# Patient Record
Sex: Female | Born: 1975 | Race: White | Hispanic: No | Marital: Married | Smoking: Current every day smoker
Health system: Southern US, Community
[De-identification: ages and names within clinical notes are randomized; demographics above are authoritative.]

## PROBLEM LIST (undated history)

## (undated) DIAGNOSIS — G43909 Migraine, unspecified, not intractable, without status migrainosus: Secondary | ICD-10-CM

## (undated) DIAGNOSIS — F329 Major depressive disorder, single episode, unspecified: Secondary | ICD-10-CM

## (undated) DIAGNOSIS — Z90710 Acquired absence of both cervix and uterus: Secondary | ICD-10-CM

## (undated) DIAGNOSIS — I10 Essential (primary) hypertension: Secondary | ICD-10-CM

## (undated) DIAGNOSIS — K219 Gastro-esophageal reflux disease without esophagitis: Secondary | ICD-10-CM

## (undated) DIAGNOSIS — F32A Depression, unspecified: Secondary | ICD-10-CM

## (undated) DIAGNOSIS — E785 Hyperlipidemia, unspecified: Secondary | ICD-10-CM

## (undated) DIAGNOSIS — Z9889 Other specified postprocedural states: Secondary | ICD-10-CM

## (undated) DIAGNOSIS — K2270 Barrett's esophagus without dysplasia: Secondary | ICD-10-CM

---

## 2009-03-05 ENCOUNTER — Ambulatory Visit: Payer: Self-pay | Admitting: Unknown Physician Specialty

## 2009-07-01 ENCOUNTER — Ambulatory Visit: Payer: Self-pay | Admitting: Gynecologic Oncology

## 2009-07-31 ENCOUNTER — Ambulatory Visit: Payer: Self-pay | Admitting: Gynecologic Oncology

## 2009-08-11 ENCOUNTER — Ambulatory Visit: Payer: Self-pay | Admitting: Gynecologic Oncology

## 2009-08-31 ENCOUNTER — Ambulatory Visit: Payer: Self-pay | Admitting: Gynecologic Oncology

## 2009-10-01 ENCOUNTER — Ambulatory Visit: Payer: Self-pay | Admitting: Gynecologic Oncology

## 2009-10-27 ENCOUNTER — Ambulatory Visit: Payer: Self-pay | Admitting: Gynecologic Oncology

## 2009-10-31 ENCOUNTER — Ambulatory Visit: Payer: Self-pay | Admitting: Gynecologic Oncology

## 2009-11-03 ENCOUNTER — Ambulatory Visit: Payer: Self-pay | Admitting: Gynecologic Oncology

## 2009-11-10 ENCOUNTER — Ambulatory Visit: Payer: Self-pay | Admitting: Gynecologic Oncology

## 2009-11-17 ENCOUNTER — Ambulatory Visit: Payer: Self-pay | Admitting: Gynecologic Oncology

## 2009-12-01 ENCOUNTER — Ambulatory Visit: Payer: Self-pay | Admitting: Gynecologic Oncology

## 2010-01-12 ENCOUNTER — Ambulatory Visit: Payer: Self-pay | Admitting: Gynecologic Oncology

## 2010-01-17 ENCOUNTER — Emergency Department: Payer: Self-pay | Admitting: Unknown Physician Specialty

## 2010-01-31 ENCOUNTER — Ambulatory Visit: Payer: Self-pay | Admitting: Gynecologic Oncology

## 2010-02-23 ENCOUNTER — Ambulatory Visit: Payer: Self-pay

## 2010-03-02 ENCOUNTER — Inpatient Hospital Stay: Payer: Self-pay

## 2011-10-27 ENCOUNTER — Ambulatory Visit: Payer: Self-pay | Admitting: Internal Medicine

## 2012-01-10 ENCOUNTER — Ambulatory Visit: Payer: Self-pay | Admitting: Unknown Physician Specialty

## 2012-11-26 ENCOUNTER — Ambulatory Visit: Payer: Self-pay | Admitting: Internal Medicine

## 2012-11-29 ENCOUNTER — Ambulatory Visit: Payer: Self-pay | Admitting: Internal Medicine

## 2014-02-12 ENCOUNTER — Ambulatory Visit: Payer: Self-pay | Admitting: Internal Medicine

## 2015-04-29 ENCOUNTER — Other Ambulatory Visit: Payer: Self-pay | Admitting: Internal Medicine

## 2015-04-29 DIAGNOSIS — Z1231 Encounter for screening mammogram for malignant neoplasm of breast: Secondary | ICD-10-CM

## 2015-05-12 ENCOUNTER — Emergency Department: Payer: BLUE CROSS/BLUE SHIELD

## 2015-05-12 ENCOUNTER — Encounter: Payer: Self-pay | Admitting: Emergency Medicine

## 2015-05-12 ENCOUNTER — Inpatient Hospital Stay
Admission: EM | Admit: 2015-05-12 | Discharge: 2015-05-13 | DRG: 872 | Disposition: A | Discharge status: Home / Self Care | Payer: BLUE CROSS/BLUE SHIELD | Attending: Internal Medicine | Admitting: Internal Medicine

## 2015-05-12 DIAGNOSIS — K219 Gastro-esophageal reflux disease without esophagitis: Secondary | ICD-10-CM | POA: Diagnosis present

## 2015-05-12 DIAGNOSIS — Z8249 Family history of ischemic heart disease and other diseases of the circulatory system: Secondary | ICD-10-CM | POA: Diagnosis not present

## 2015-05-12 DIAGNOSIS — N3000 Acute cystitis without hematuria: Secondary | ICD-10-CM | POA: Diagnosis present

## 2015-05-12 DIAGNOSIS — Z90710 Acquired absence of both cervix and uterus: Secondary | ICD-10-CM

## 2015-05-12 DIAGNOSIS — N1330 Unspecified hydronephrosis: Secondary | ICD-10-CM | POA: Diagnosis present

## 2015-05-12 DIAGNOSIS — R109 Unspecified abdominal pain: Secondary | ICD-10-CM

## 2015-05-12 DIAGNOSIS — Z823 Family history of stroke: Secondary | ICD-10-CM

## 2015-05-12 DIAGNOSIS — I10 Essential (primary) hypertension: Secondary | ICD-10-CM | POA: Diagnosis present

## 2015-05-12 DIAGNOSIS — N10 Acute tubulo-interstitial nephritis: Secondary | ICD-10-CM | POA: Diagnosis present

## 2015-05-12 DIAGNOSIS — B9620 Unspecified Escherichia coli [E. coli] as the cause of diseases classified elsewhere: Secondary | ICD-10-CM | POA: Diagnosis present

## 2015-05-12 DIAGNOSIS — N12 Tubulo-interstitial nephritis, not specified as acute or chronic: Secondary | ICD-10-CM | POA: Diagnosis not present

## 2015-05-12 DIAGNOSIS — A419 Sepsis, unspecified organism: Secondary | ICD-10-CM | POA: Diagnosis not present

## 2015-05-12 DIAGNOSIS — K2270 Barrett's esophagus without dysplasia: Secondary | ICD-10-CM | POA: Diagnosis present

## 2015-05-12 HISTORY — DX: Essential (primary) hypertension: I10

## 2015-05-12 HISTORY — DX: Barrett's esophagus without dysplasia: K22.70

## 2015-05-12 HISTORY — DX: Gastro-esophageal reflux disease without esophagitis: K21.9

## 2015-05-12 HISTORY — DX: Hyperlipidemia, unspecified: E78.5

## 2015-05-12 HISTORY — DX: Migraine, unspecified, not intractable, without status migrainosus: G43.909

## 2015-05-12 HISTORY — DX: Other specified postprocedural states: Z98.890

## 2015-05-12 HISTORY — DX: Major depressive disorder, single episode, unspecified: F32.9

## 2015-05-12 HISTORY — DX: Acquired absence of both cervix and uterus: Z90.710

## 2015-05-12 HISTORY — DX: Depression, unspecified: F32.A

## 2015-05-12 LAB — URINALYSIS COMPLETE WITH MICROSCOPIC (ARMC ONLY)
BILIRUBIN URINE: NEGATIVE
Glucose, UA: NEGATIVE mg/dL
KETONES UR: NEGATIVE mg/dL
NITRITE: NEGATIVE
PH: 9 — AB (ref 5.0–8.0)
Protein, ur: 100 mg/dL — AB
Specific Gravity, Urine: 1.015 (ref 1.005–1.030)
TRANS EPITHEL UA: 1

## 2015-05-12 LAB — COMPREHENSIVE METABOLIC PANEL
ALT: 13 U/L — AB (ref 14–54)
ANION GAP: 11 (ref 5–15)
AST: 37 U/L (ref 15–41)
Albumin: 4.3 g/dL (ref 3.5–5.0)
Alkaline Phosphatase: 112 U/L (ref 38–126)
BILIRUBIN TOTAL: 0.6 mg/dL (ref 0.3–1.2)
BUN: 11 mg/dL (ref 6–20)
CO2: 24 mmol/L (ref 22–32)
CREATININE: 0.69 mg/dL (ref 0.44–1.00)
Calcium: 9 mg/dL (ref 8.9–10.3)
Chloride: 102 mmol/L (ref 101–111)
GFR calc Af Amer: >60 mL/min (ref >60)
Glucose, Bld: 90 mg/dL (ref 65–99)
Potassium: 3.6 mmol/L (ref 3.5–5.1)
Sodium: 137 mmol/L (ref 135–145)
TOTAL PROTEIN: 7.6 g/dL (ref 6.5–8.1)

## 2015-05-12 LAB — CBC
HCT: 41.9 % (ref 35.0–47.0)
Hemoglobin: 14.6 g/dL (ref 12.0–16.0)
MCH: 36.4 pg — AB (ref 26.0–34.0)
MCHC: 34.8 g/dL (ref 32.0–36.0)
MCV: 104.6 fL — AB (ref 80.0–100.0)
PLATELETS: 214 10*3/uL (ref 150–440)
RBC: 4.01 MIL/uL (ref 3.80–5.20)
RDW: 12.9 % (ref 11.5–14.5)
WBC: 12.6 10*3/uL — AB (ref 3.6–11.0)

## 2015-05-12 LAB — LACTIC ACID, PLASMA: Lactic Acid, Venous: 1.7 mmol/L (ref 0.5–2.0)

## 2015-05-12 LAB — LIPASE, BLOOD: LIPASE: 47 U/L (ref 11–51)

## 2015-05-12 MED ORDER — HYDROMORPHONE HCL 1 MG/ML IJ SOLN
0.5000 mg | Freq: Once | INTRAMUSCULAR | Status: AC
  Administered 2015-05-12: 0.5 mg via INTRAVENOUS
  Filled 2015-05-12: qty 1

## 2015-05-12 MED ORDER — POLYETHYLENE GLYCOL 3350 17 G PO PACK: 17.0000 g | PACK | Freq: Every day | ORAL | Status: DC | PRN

## 2015-05-12 MED ORDER — MORPHINE SULFATE (PF) 2 MG/ML IV SOLN
2.0000 mg | INTRAVENOUS | Status: DC | PRN
  Administered 2015-05-12: 2 mg via INTRAVENOUS
  Filled 2015-05-12: qty 1

## 2015-05-12 MED ORDER — CLONAZEPAM 0.5 MG PO TABS: 0.2500 mg | ORAL_TABLET | Freq: Every evening | ORAL | Status: DC | PRN

## 2015-05-12 MED ORDER — KETOROLAC TROMETHAMINE 30 MG/ML IJ SOLN
30.0000 mg | Freq: Three times a day (TID) | INTRAMUSCULAR | Status: AC
  Administered 2015-05-12 – 2015-05-13 (×3): 30 mg via INTRAVENOUS
  Filled 2015-05-12 (×3): qty 1

## 2015-05-12 MED ORDER — ONDANSETRON HCL 4 MG/2ML IJ SOLN: 4.0000 mg | Freq: Four times a day (QID) | INTRAMUSCULAR | Status: DC | PRN

## 2015-05-12 MED ORDER — ENOXAPARIN SODIUM 40 MG/0.4ML ~~LOC~~ SOLN
40.0000 mg | SUBCUTANEOUS | Status: DC
  Filled 2015-05-12 (×2): qty 0.4

## 2015-05-12 MED ORDER — SODIUM CHLORIDE 0.9 % IV BOLUS (SEPSIS)
1000.0000 mL | Freq: Once | INTRAVENOUS | Status: AC
  Administered 2015-05-12: 1000 mL via INTRAVENOUS

## 2015-05-12 MED ORDER — PHENAZOPYRIDINE HCL 100 MG PO TABS
100.0000 mg | ORAL_TABLET | Freq: Three times a day (TID) | ORAL | Status: DC
  Administered 2015-05-12 – 2015-05-13 (×4): 100 mg via ORAL
  Filled 2015-05-12 (×8): qty 1

## 2015-05-12 MED ORDER — DOCUSATE SODIUM 100 MG PO CAPS
100.0000 mg | ORAL_CAPSULE | Freq: Two times a day (BID) | ORAL | Status: DC
  Administered 2015-05-12 – 2015-05-13 (×3): 100 mg via ORAL
  Filled 2015-05-12 (×3): qty 1

## 2015-05-12 MED ORDER — ONDANSETRON HCL 4 MG/2ML IJ SOLN
4.0000 mg | Freq: Once | INTRAMUSCULAR | Status: AC
  Administered 2015-05-12: 4 mg via INTRAVENOUS
  Filled 2015-05-12: qty 2

## 2015-05-12 MED ORDER — POTASSIUM CHLORIDE IN NACL 20-0.9 MEQ/L-% IV SOLN
INTRAVENOUS | Status: AC
  Administered 2015-05-12 – 2015-05-13 (×2): via INTRAVENOUS
  Filled 2015-05-12 (×2): qty 1000

## 2015-05-12 MED ORDER — ACETAMINOPHEN 650 MG RE SUPP
650.0000 mg | Freq: Four times a day (QID) | RECTAL | Status: DC | PRN
Start: 2015-05-12 — End: 2015-05-13

## 2015-05-12 MED ORDER — CEFTRIAXONE SODIUM 1 G IJ SOLR
1.0000 g | INTRAMUSCULAR | Status: DC
Start: 2015-05-13 — End: 2015-05-13
  Administered 2015-05-13: 1 g via INTRAVENOUS
  Filled 2015-05-12: qty 10

## 2015-05-12 MED ORDER — PANTOPRAZOLE SODIUM 40 MG PO TBEC
40.0000 mg | DELAYED_RELEASE_TABLET | Freq: Every day | ORAL | Status: DC
  Administered 2015-05-13: 40 mg via ORAL
  Filled 2015-05-12: qty 1

## 2015-05-12 MED ORDER — ONDANSETRON HCL 4 MG PO TABS: 4.0000 mg | ORAL_TABLET | Freq: Four times a day (QID) | ORAL | Status: DC | PRN

## 2015-05-12 MED ORDER — AMLODIPINE BESYLATE 5 MG PO TABS
5.0000 mg | ORAL_TABLET | Freq: Every day | ORAL | Status: DC
  Administered 2015-05-12 – 2015-05-13 (×2): 5 mg via ORAL
  Filled 2015-05-12 (×2): qty 1

## 2015-05-12 MED ORDER — ACETAMINOPHEN 325 MG PO TABS
650.0000 mg | ORAL_TABLET | Freq: Four times a day (QID) | ORAL | Status: DC | PRN
Start: 2015-05-12 — End: 2015-05-13

## 2015-05-12 MED ORDER — SODIUM CHLORIDE 0.9 % IV SOLN
1000.0000 mL | Freq: Once | INTRAVENOUS | Status: AC
  Administered 2015-05-12: 1000 mL via INTRAVENOUS

## 2015-05-12 MED ORDER — DEXTROSE 5 % IV SOLN
1.0000 g | Freq: Once | INTRAVENOUS | Status: AC
  Administered 2015-05-12: 1 g via INTRAVENOUS
  Filled 2015-05-12: qty 10

## 2015-05-12 MED ORDER — HYDRALAZINE HCL 20 MG/ML IJ SOLN: 10.0000 mg | Freq: Four times a day (QID) | INTRAMUSCULAR | Status: DC | PRN

## 2015-05-12 MED ORDER — HYDROMORPHONE HCL 1 MG/ML IJ SOLN
0.5000 mg | Freq: Once | INTRAMUSCULAR | Status: AC
Start: 2015-05-12 — End: 2015-05-12
  Administered 2015-05-12: 0.5 mg via INTRAVENOUS
  Filled 2015-05-12: qty 1

## 2015-05-12 MED ORDER — ALBUTEROL SULFATE (2.5 MG/3ML) 0.083% IN NEBU: 2.5000 mg | INHALATION_SOLUTION | RESPIRATORY_TRACT | Status: DC | PRN

## 2015-05-12 MED ORDER — HYDROCODONE-ACETAMINOPHEN 5-325 MG PO TABS
1.0000 | ORAL_TABLET | ORAL | Status: DC | PRN
  Filled 2015-05-12: qty 2

## 2015-05-12 NOTE — ED Provider Notes (Signed)
[facility] Emergency Department Provider Note  ____________________________________________    I have reviewed the triage vital signs and the nursing notes.   HISTORY  Chief Complaint Abdominal Pain    HPI Ellajane Ermelinda Eckert is a 40 y.o. female who presents with complaints of left lower abdominal pain which started 3 days ago. Patient reports mild left lower quadrant abdominal pain on Friday which has steadily worsened. She reports intermittent sharp squeezing severe pain now. No history of similar. No nausea or vomiting. She has noticed blood in her urine. No fevers or chills. No recent travel. No history of diverticulitis     Past Medical History  Diagnosis Date  . Depression   . GERD (gastroesophageal reflux disease)   . Barrett esophagus   . Migraine   . Hypertension   . Hyperlipemia   . H/O: hysterectomy   . History of esophagogastroduodenoscopy (EGD)     09/2014    Patient Active Problem List   Diagnosis Date Noted  . Pyelonephritis 05/12/2015    History reviewed. No pertinent past surgical history.  No current outpatient prescriptions on file.  Allergies Review of patient's allergies indicates no known allergies.  Family History  Problem Relation Age of Onset  . Heart attack Mother   . Stroke Father     Social History Social History  Substance Use Topics  . Smoking status: Current Every Day Smoker  . Smokeless tobacco: None  . Alcohol Use: 0.0 oz/week    0 Standard drinks or equivalent per week    Review of Systems  Constitutional: Negative for fever. Eyes: Negative for redness ENT: Negative for sore throat Cardiovascular: Negative for chest pain Respiratory: Negative for shortness of breath. Gastrointestinal: As above Genitourinary: Positive for hematuria Musculoskeletal: Negative for back pain. Skin: Negative for rash. Neurological: Negative for focal weakness Psychiatric: no  anxiety    ____________________________________________   PHYSICAL EXAM:  VITAL SIGNS: ED Triage Vitals  Enc Vitals Group     BP 05/12/15 0816 157/117 mmHg     Pulse Rate 05/12/15 0816 115     Resp 05/12/15 0816 18     Temp 05/12/15 0816 98.2 F (36.8 C)     Temp Source 05/12/15 0816 Oral     SpO2 05/12/15 0816 97 %     Weight 05/12/15 0816 125 lb (56.7 kg)     Height 05/12/15 0816 5' (1.524 m)     Head Cir --      Peak Flow --      Pain Score 05/12/15 0816 7     Pain Loc --      Pain Edu? --      Excl. in GC? --      Constitutional: Alert and oriented. Well appearing and in no distress.  Eyes: Conjunctivae are normal. No erythema or injection ENT   Head: Normocephalic and atraumatic.   Mouth/Throat: Mucous membranes are moist. Cardiovascular: Normal rate, regular rhythm. Normal and symmetric distal pulses are present in the upper extremities.  Respiratory: Normal respiratory effort without tachypnea nor retractions. Breath sounds are clear and equal bilaterally.  Gastrointestinal: Tenderness to palpation of left lower quadrant. No distention. There is Left CVA tenderness. Genitourinary: deferred Musculoskeletal: Nontender with normal range of motion in all extremities. No lower extremity tenderness nor edema. Neurologic:  Normal speech and language. No gross focal neurologic deficits are appreciated. Skin:  Skin is warm, dry and intact. No rash noted. Psychiatric: Mood and affect are normal. Patient exhibits appropriate insight  and judgment.  ____________________________________________    LABS (pertinent positives/negatives)  Labs Reviewed  CBC - Abnormal; Notable for the following:    WBC 12.6 (*)    MCV 104.6 (*)    MCH 36.4 (*)    All other components within normal limits  COMPREHENSIVE METABOLIC PANEL - Abnormal; Notable for the following:    ALT 13 (*)    All other components within normal limits  URINALYSIS COMPLETEWITH MICROSCOPIC ([facility] ONLY)  - Abnormal; Notable for the following:    Color, Urine YELLOW (*)    APPearance CLOUDY (*)    Hgb urine dipstick 2+ (*)    pH 9.0 (*)    Protein, ur 100 (*)    Leukocytes, UA 3+ (*)    Bacteria, UA FEW (*)    Squamous Epithelial / LPF 0-5 (*)    All other components within normal limits  URINE CULTURE  LIPASE, BLOOD  LACTIC ACID, PLASMA    ____________________________________________   EKG  None  ____________________________________________    RADIOLOGY  CT shows no ureterolithiasis  ____________________________________________   PROCEDURES  Procedure(s) performed: none  Critical Care performed: none  ____________________________________________   INITIAL IMPRESSION / ASSESSMENT AND PLAN / ED COURSE  Pertinent labs & imaging results that were available during my care of the patient were reviewed by me and considered in my medical decision making (see chart for details).  Patient presents with left lower quadrant abdominal pain. She is tachycardic and obvious syncopal. Differential diagnosis includes ureterolithiasis, diverticulitis, colitis, UTI/pyelonephritis. We will give IV Dilaudid and IV Zofran for pain control and obtain CT abdomen pelvis.  Patient with evidence of UTI, left CVA tenderness. This is consistent with pyelonephritis, CT abdomen and pelvis does not show obstructing stone or any stone at all. Rocephin IV given  Patient with continued pain despite multiple doses of IV analgesics. I will admit her to the hospital for antibiotics and further care.  ____________________________________________   FINAL CLINICAL IMPRESSION(S) / ED DIAGNOSES  Final diagnoses:  Flank pain, acute  Pyelonephritis, acute        Jene Everyobert Khylen Riolo, MD 05/12/15 1042

## 2015-05-12 NOTE — ED Notes (Signed)
Pt to ed with c/o left lower abd pain that started on Friday.  Pt reports clots in urine and pain severe in left lower abd.  Pt Last bm was this am, wnl.

## 2015-05-12 NOTE — H&P (Signed)
[facility] [place] [facility]   PATIENT NAME: Jasmin RainbowKimbie Boutelle    MR#:  [id number]  DATE OF BIRTH:  23-Aug-1975  DATE OF ADMISSION:  05/12/2015  PRIMARY CARE PHYSICIAN: SPARKS,JEFFREY D, MD   REQUESTING/REFERRING PHYSICIAN: Dr. Cyril LoosenKinner  CHIEF COMPLAINT:   Chief Complaint  Patient presents with  . Abdominal Pain    HISTORY OF PRESENT ILLNESS:  Jasmin Morgan  is a 40 y.o. female with a known history of hypertension, migraines, Barrett's esophagus presents to the hospital complaining of 4 days of left lower abdominal pain or flank pain. She has noticed some hematuria with mucousy urine. Frequency and dysuria. Here in the emergency room patient has cystitis on CAT scan along with mild left hydronephrosis without any stricture or stone. Continues to have significant pain in spite of 2 doses of Dilaudid. White count is elevated with tachycardia. Patient is being admitted for left-sided pyonephritis with sepsis. No recent antibiotic use or urological procedures. PAST MEDICAL HISTORY:   Past Medical History  Diagnosis Date  . Depression   . GERD (gastroesophageal reflux disease)   . Barrett esophagus   . Migraine   . Hypertension   . Hyperlipemia   . H/O: hysterectomy   . History of esophagogastroduodenoscopy (EGD)     09/2014    PAST SURGICAL HISTORY:  History reviewed. No pertinent past surgical history.  SOCIAL HISTORY:   Social History  Substance Use Topics  . Smoking status: Current Every Day Smoker  . Smokeless tobacco: Not on file  . Alcohol Use: 0.0 oz/week    0 Standard drinks or equivalent per week    FAMILY HISTORY:   Family History  Problem Relation Age of Onset  . Heart attack Mother   . Stroke Father     DRUG ALLERGIES:  No Known Allergies  REVIEW OF SYSTEMS:   Review of Systems  Constitutional: Positive for malaise/fatigue. Negative for fever and chills.  HENT: Negative for sore throat.   Eyes: Negative for blurred  vision, double vision and pain.  Respiratory: Negative for cough, hemoptysis, shortness of breath and wheezing.   Cardiovascular: Negative for chest pain, palpitations, orthopnea and leg swelling.  Gastrointestinal: Positive for abdominal pain. Negative for heartburn, nausea, vomiting, diarrhea and constipation.  Genitourinary: Positive for dysuria, urgency, frequency, hematuria and flank pain.  Musculoskeletal: Negative for back pain and joint pain.  Skin: Negative for rash.  Neurological: Positive for weakness. Negative for sensory change, speech change, focal weakness and headaches.  Endo/Heme/Allergies: Does not bruise/bleed easily.  Psychiatric/Behavioral: Negative for depression. The patient is not nervous/anxious.     MEDICATIONS AT HOME:   Prior to Admission medications   Not on File     VITAL SIGNS:  Blood pressure 161/107, pulse 91, temperature 98.2 F (36.8 C), temperature source Oral, resp. rate 21, height 5' (1.524 m), weight 56.7 kg (125 lb), SpO2 98 %.  PHYSICAL EXAMINATION:  Physical Exam  GENERAL:  40 y.o.-year-old patient lying in the bed with no acute distress.  EYES: Pupils equal, round, reactive to light and accommodation. No scleral icterus. Extraocular muscles intact.  HEENT: Head atraumatic, normocephalic. Oropharynx and nasopharynx clear. No oropharyngeal erythema, moist oral mucosa  NECK:  Supple, no jugular venous distention. No thyroid enlargement, no tenderness.  LUNGS: Normal breath sounds bilaterally, no wheezing, rales, rhonchi. No use of accessory muscles of respiration.  CARDIOVASCULAR: S1, S2 normal. No murmurs, rubs, or gallops.  ABDOMEN: Soft, nondistended. Bowel sounds present. No organomegaly or mass. Tenderness  left lower quadrant and left CVA area. EXTREMITIES: No pedal edema, cyanosis, or clubbing. + 2 pedal & radial pulses b/l.   NEUROLOGIC: Cranial nerves II through XII are intact. No focal Motor or sensory deficits appreciated  b/l PSYCHIATRIC: The patient is alert and oriented x 3. Good affect.  SKIN: No obvious rash, lesion, or ulcer.   LABORATORY PANEL:   CBC  Recent Labs Lab 05/12/15 0830  WBC 12.6*  HGB 14.6  HCT 41.9  PLT 214   ------------------------------------------------------------------------------------------------------------------  Chemistries   Recent Labs Lab 05/12/15 0830  NA 137  K 3.6  CL 102  CO2 24  GLUCOSE 90  BUN 11  CREATININE 0.69  CALCIUM 9.0  AST 37  ALT 13*  ALKPHOS 112  BILITOT 0.6   ------------------------------------------------------------------------------------------------------------------  Cardiac Enzymes No results for input(s): TROPONINI in the last 168 hours. ------------------------------------------------------------------------------------------------------------------  RADIOLOGY:  Ct Renal Stone Study  05/12/2015  CLINICAL DATA:  Acute left lower quadrant abdominal pain. EXAM: CT ABDOMEN AND PELVIS WITHOUT CONTRAST TECHNIQUE: Multidetector CT imaging of the abdomen and pelvis was performed following the standard protocol without IV contrast. COMPARISON:  None. FINDINGS: Visualized lung bases are unremarkable. No significant osseous abnormality is noted. No gallstones are noted. No focal abnormality is noted in the liver, spleen or pancreas on these unenhanced images. Adrenal glands are unremarkable. Right kidney and ureter appear normal. Mild left hydroureteronephrosis is noted, but no obstructing calculus is noted. No renal calculi are noted. Urinary bladder is nondistended with surrounding inflammatory changes suggesting possible cystitis. The appendix appears normal. There is no evidence of bowel obstruction. No abnormal fluid collection is noted. Status post hysterectomy. No significant adenopathy is noted. IMPRESSION: Mild left hydroureteronephrosis is noted without obstructing calculus. No renal calculi are noted. Urinary bladder is nondistended  with surrounding inflammatory changes suggesting possible cystitis. Electronically Signed   By: Lupita Raider, M.D.   On: 05/12/2015 09:51     IMPRESSION AND PLAN:   * Acute pyelonephritis with sepsis IV ceftriaxone Urine cx IVF bolus and maintenance fluids  * HTN Start Norvasc 5 mg daily. Hydralazine IV PRN  * DVT prophylaxis Lovenox  All the records are reviewed and case discussed with ED provider. Management plans discussed with the patient, family and they are in agreement.  CODE STATUS: FULL CODE  TOTAL TIME TAKING CARE OF THIS PATIENT: 40 minutes.   Milagros Loll R M.D on 05/12/2015 at 10:28 AM  Between 7am to 6pm - Pager - [phone]  After 6pm go to [email or web address] - password [email or web address] [facility]  [place]  Office  [phone]  CC: Primary care physician; Marguarite Arbour, MD  Note: This dictation was prepared with Dragon dictation along with smaller phrase technology. Any transcriptional errors that result from this process are unintentional.

## 2015-05-13 LAB — BASIC METABOLIC PANEL
ANION GAP: 6 (ref 5–15)
BUN: 11 mg/dL (ref 6–20)
CO2: 25 mmol/L (ref 22–32)
Calcium: 8.6 mg/dL — ABNORMAL LOW (ref 8.9–10.3)
Chloride: 105 mmol/L (ref 101–111)
Creatinine, Ser: 0.55 mg/dL (ref 0.44–1.00)
GFR calc Af Amer: >60 mL/min (ref >60)
GLUCOSE: 93 mg/dL (ref 65–99)
POTASSIUM: 4.1 mmol/L (ref 3.5–5.1)
Sodium: 136 mmol/L (ref 135–145)

## 2015-05-13 LAB — CBC
HEMATOCRIT: 37.5 % (ref 35.0–47.0)
HEMOGLOBIN: 13.1 g/dL (ref 12.0–16.0)
MCH: 36.1 pg — AB (ref 26.0–34.0)
MCHC: 34.8 g/dL (ref 32.0–36.0)
MCV: 103.6 fL — ABNORMAL HIGH (ref 80.0–100.0)
Platelets: 154 10*3/uL (ref 150–440)
RBC: 3.62 MIL/uL — ABNORMAL LOW (ref 3.80–5.20)
RDW: 12.6 % (ref 11.5–14.5)
WBC: 6 10*3/uL (ref 3.6–11.0)

## 2015-05-13 MED ORDER — LEVOFLOXACIN 500 MG PO TABS: 500.0000 mg | ORAL_TABLET | Freq: Every day | ORAL | Status: AC

## 2015-05-13 MED ORDER — AMLODIPINE BESYLATE 5 MG PO TABS: 5.0000 mg | ORAL_TABLET | Freq: Every day | ORAL | Status: AC

## 2015-05-13 MED ORDER — TRAMADOL HCL 50 MG PO TABS
50.0000 mg | ORAL_TABLET | Freq: Four times a day (QID) | ORAL | Status: AC | PRN
Start: 2015-05-13 — End: ?

## 2015-05-13 NOTE — Discharge Summary (Signed)
[facility] - [place] at [facility]   PATIENT NAME: Jasmin Morgan    MR#:  [id number]  DATE OF BIRTH:  04-18-1975  DATE OF ADMISSION:  05/12/2015 ADMITTING PHYSICIAN: Milagros Loll, MD  DATE OF DISCHARGE: 05/13/2015  PRIMARY CARE PHYSICIAN: SPARKS,JEFFREY D, MD    ADMISSION DIAGNOSIS:  Pyelonephritis [N12] Flank pain, acute [R10.10]  DISCHARGE DIAGNOSIS:  Active Problems:   Pyelonephritis   SECONDARY DIAGNOSIS:   Past Medical History  Diagnosis Date  . Depression   . GERD (gastroesophageal reflux disease)   . Barrett esophagus   . Migraine   . Hypertension   . Hyperlipemia   . H/O: hysterectomy   . History of esophagogastroduodenoscopy (EGD)     09/2014    HOSPITAL COURSE:   40 year old female with past medical history significant for hyperlipidemia, depression and anxiety and hypertension not taking medications presents to the hospital secondary to significant left flank pain and noted to have acute pyelonephritis  #1 sepsis-secondary to acute left-sided pyelonephritis and acute cystitis. -Urine cultures growing Escherichia coli. -Received Rocephin in the hospital. CT of the abdomen showing no stone but mild left-sided hydroureteronephrosis. -Clinically much better. WBC improving. -Being discharged on Levaquin  #2 hypertension-not taking medications at home. Started on Norvasc 5 mg daily. Patient will follow up with PCP  #3 GERD-on Prilosec  #4 anxiety-continue Klonopin  Patient is stable for discharge today.  DISCHARGE CONDITIONS:   Stable  CONSULTS OBTAINED:    none  DRUG ALLERGIES:  No Known Allergies  DISCHARGE MEDICATIONS:   Current Discharge Medication List    START taking these medications   Details  amLODipine (NORVASC) 5 MG tablet Take 1 tablet (5 mg total) by mouth daily. Qty: 30 tablet, Refills: 2    levofloxacin (LEVAQUIN) 500 MG tablet Take 1 tablet (500 mg total) by mouth daily. X 8 more days Qty: 8  tablet, Refills: 0    traMADol (ULTRAM) 50 MG tablet Take 1 tablet (50 mg total) by mouth every 6 (six) hours as needed for moderate pain or severe pain. Qty: 20 tablet, Refills: 0      CONTINUE these medications which have NOT CHANGED   Details  Cascara [place] 450 MG CAPS Take 1 capsule by mouth at bedtime.    clonazePAM (KLONOPIN) 0.5 MG tablet Take 0.25-0.5 mg by mouth at bedtime as needed.     omeprazole (PRILOSEC) 40 MG capsule Take 1 capsule by mouth daily as needed.         DISCHARGE INSTRUCTIONS:   1. PCP follow-up in 1-2 weeks  If you experience worsening of your admission symptoms, develop shortness of breath, life threatening emergency, suicidal or homicidal thoughts you must seek medical attention immediately by calling 911 or calling your MD immediately  if symptoms less severe.  You Must read complete instructions/literature along with all the possible adverse reactions/side effects for all the Medicines you take and that have been prescribed to you. Take any new Medicines after you have completely understood and accept all the possible adverse reactions/side effects.   Please note  You were cared for by a hospitalist during your hospital stay. If you have any questions about your discharge medications or the care you received while you were in the hospital after you are discharged, you can call the unit and asked to speak with the hospitalist on call if the hospitalist that took care of you is not available. Once you are discharged, your primary care physician will handle any further  medical issues. Please note that NO REFILLS for any discharge medications will be authorized once you are discharged, as it is imperative that you return to your primary care physician (or establish a relationship with a primary care physician if you do not have one) for your aftercare needs so that they can reassess your need for medications and monitor your lab values.    Today   CHIEF  COMPLAINT:   Chief Complaint  Patient presents with  . Abdominal Pain     VITAL SIGNS:  Blood pressure 144/78, pulse 76, temperature 97.7 F (36.5 C), temperature source Oral, resp. rate 16, height 5' (1.524 m), weight 56.564 kg (124 lb 11.2 oz), SpO2 100 %.  I/O:   Intake/Output Summary (Last 24 hours) at 05/13/15 1420 Last data filed at 05/13/15 0900  Gross per 24 hour  Intake 1807.07 ml  Output   2900 ml  Net -1092.93 ml    PHYSICAL EXAMINATION:   Physical Exam  GENERAL:  40 y.o.-year-old patient lying in the bed with no acute distress.  EYES: Pupils equal, round, reactive to light and accommodation. No scleral icterus. Extraocular muscles intact.  HEENT: Head atraumatic, normocephalic. Oropharynx and nasopharynx clear.  NECK:  Supple, no jugular venous distention. No thyroid enlargement, no tenderness.  LUNGS: Normal breath sounds bilaterally, no wheezing, rales,rhonchi or crepitation. No use of accessory muscles of respiration.  CARDIOVASCULAR: S1, S2 normal. No murmurs, rubs, or gallops.  ABDOMEN: Soft, non-tender, non-distended. Bowel sounds present. No organomegaly or mass.  Minimal left CVA tenderness EXTREMITIES: No pedal edema, cyanosis, or clubbing.  NEUROLOGIC: Cranial nerves II through XII are intact. Muscle strength 5/5 in all extremities. Sensation intact. Gait not checked.  PSYCHIATRIC: The patient is alert and oriented x 3.  SKIN: No obvious rash, lesion, or ulcer.   DATA REVIEW:   CBC  Recent Labs Lab 05/13/15 0500  WBC 6.0  HGB 13.1  HCT 37.5  PLT 154    Chemistries   Recent Labs Lab 05/12/15 0830 05/13/15 0500  NA 137 136  K 3.6 4.1  CL 102 105  CO2 24 25  GLUCOSE 90 93  BUN 11 11  CREATININE 0.69 0.55  CALCIUM 9.0 8.6*  AST 37  --   ALT 13*  --   ALKPHOS 112  --   BILITOT 0.6  --     Cardiac Enzymes No results for input(s): TROPONINI in the last 168 hours.  Microbiology Results  Results for orders placed or performed  during the hospital encounter of 05/12/15  Urine culture     Status: Abnormal (Preliminary result)   Collection Time: 05/12/15  8:30 AM  Result Value Ref Range Status   Specimen Description URINE, CLEAN CATCH  Final   Special Requests NONE  Final   Culture (A)  Final    >=100,000 COLONIES/mL GRAM NEGATIVE RODS IDENTIFICATION AND SUSCEPTIBILITIES TO FOLLOW    Report Status PENDING  Incomplete    RADIOLOGY:  Ct Renal Stone Study  05/12/2015  CLINICAL DATA:  Acute left lower quadrant abdominal pain. EXAM: CT ABDOMEN AND PELVIS WITHOUT CONTRAST TECHNIQUE: Multidetector CT imaging of the abdomen and pelvis was performed following the standard protocol without IV contrast. COMPARISON:  None. FINDINGS: Visualized lung bases are unremarkable. No significant osseous abnormality is noted. No gallstones are noted. No focal abnormality is noted in the liver, spleen or pancreas on these unenhanced images. Adrenal glands are unremarkable. Right kidney and ureter appear normal. Mild left hydroureteronephrosis is noted, but no  obstructing calculus is noted. No renal calculi are noted. Urinary bladder is nondistended with surrounding inflammatory changes suggesting possible cystitis. The appendix appears normal. There is no evidence of bowel obstruction. No abnormal fluid collection is noted. Status post hysterectomy. No significant adenopathy is noted. IMPRESSION: Mild left hydroureteronephrosis is noted without obstructing calculus. No renal calculi are noted. Urinary bladder is nondistended with surrounding inflammatory changes suggesting possible cystitis. Electronically Signed   By: Lupita RaiderJames  Green Jr, M.D.   On: 05/12/2015 09:51    EKG:  No orders found for this or any previous visit.    Management plans discussed with the patient, family and they are in agreement.  CODE STATUS:     Code Status Orders        Start     Ordered   05/12/15 1027  Full code   Continuous     05/12/15 1027    Code  Status History    Date Active Date Inactive Code Status Order ID Comments User Context   This patient has a current code status but no historical code status.      TOTAL TIME TAKING CARE OF THIS PATIENT: 37 minutes.    Enid BaasKALISETTI,Eiliana Drone M.D on 05/13/2015 at 2:20 PM  Between 7am to 6pm - Pager - [phone]  After 6pm go to [email or web address] - password [email or web address] [facility]  [facility]  Office  [phone]  CC: Primary care physician; Marguarite ArbourSPARKS,JEFFREY D, MD

## 2015-05-13 NOTE — Care Management (Signed)
Per Care team, patient for discharge today and there are no discharge needs

## 2015-05-13 NOTE — Progress Notes (Signed)
Pt stable. IV removed. D/c instructions given and education provided. Signed prescriptions verified and given. Pt states she understands instructions. Pt dressed and escorted out by staff. Driven home by family.  

## 2015-05-14 ENCOUNTER — Ambulatory Visit: Payer: Self-pay

## 2015-05-14 LAB — URINE CULTURE: Culture: >=100000 — AB

## 2015-05-19 ENCOUNTER — Ambulatory Visit
Admit: 2015-05-19 | Discharge: 2015-05-19 | Disposition: A | Discharge status: Home / Self Care | Payer: BLUE CROSS/BLUE SHIELD | Attending: Internal Medicine | Admitting: Internal Medicine

## 2015-05-19 DIAGNOSIS — Z1231 Encounter for screening mammogram for malignant neoplasm of breast: Secondary | ICD-10-CM

## 2016-06-22 ENCOUNTER — Other Ambulatory Visit: Payer: Self-pay | Admitting: Internal Medicine

## 2016-06-22 DIAGNOSIS — Z1231 Encounter for screening mammogram for malignant neoplasm of breast: Secondary | ICD-10-CM

## 2016-07-18 ENCOUNTER — Ambulatory Visit
Admission: RE | Admit: 2016-07-18 | Discharge: 2016-07-18 | Disposition: A | Discharge status: Home / Self Care | Payer: BLUE CROSS/BLUE SHIELD | Source: Ambulatory Visit | Attending: Internal Medicine | Admitting: Internal Medicine

## 2016-07-18 DIAGNOSIS — Z1231 Encounter for screening mammogram for malignant neoplasm of breast: Secondary | ICD-10-CM | POA: Diagnosis present

## 2017-06-08 ENCOUNTER — Other Ambulatory Visit: Payer: Self-pay | Admitting: Internal Medicine

## 2017-06-08 DIAGNOSIS — Z1231 Encounter for screening mammogram for malignant neoplasm of breast: Secondary | ICD-10-CM

## 2017-07-21 ENCOUNTER — Ambulatory Visit
Admission: RE | Admit: 2017-07-21 | Discharge: 2017-07-21 | Disposition: A | Discharge status: Home / Self Care | Payer: BLUE CROSS/BLUE SHIELD | Source: Ambulatory Visit | Attending: Internal Medicine | Admitting: Internal Medicine

## 2017-07-21 DIAGNOSIS — Z1231 Encounter for screening mammogram for malignant neoplasm of breast: Secondary | ICD-10-CM | POA: Diagnosis not present

## 2018-06-22 ENCOUNTER — Other Ambulatory Visit: Payer: Self-pay | Admitting: Internal Medicine

## 2018-06-22 DIAGNOSIS — Z1231 Encounter for screening mammogram for malignant neoplasm of breast: Secondary | ICD-10-CM

## 2019-09-26 IMAGING — MG MM DIGITAL SCREENING BILAT W/ CAD
4 series · 4 of 4 positions shown · non-contrast
Comparison: Previous exam(s).

CLINICAL DATA: Screening.

EXAM:
DIGITAL SCREENING BILATERAL MAMMOGRAM WITH CAD

[L CC]
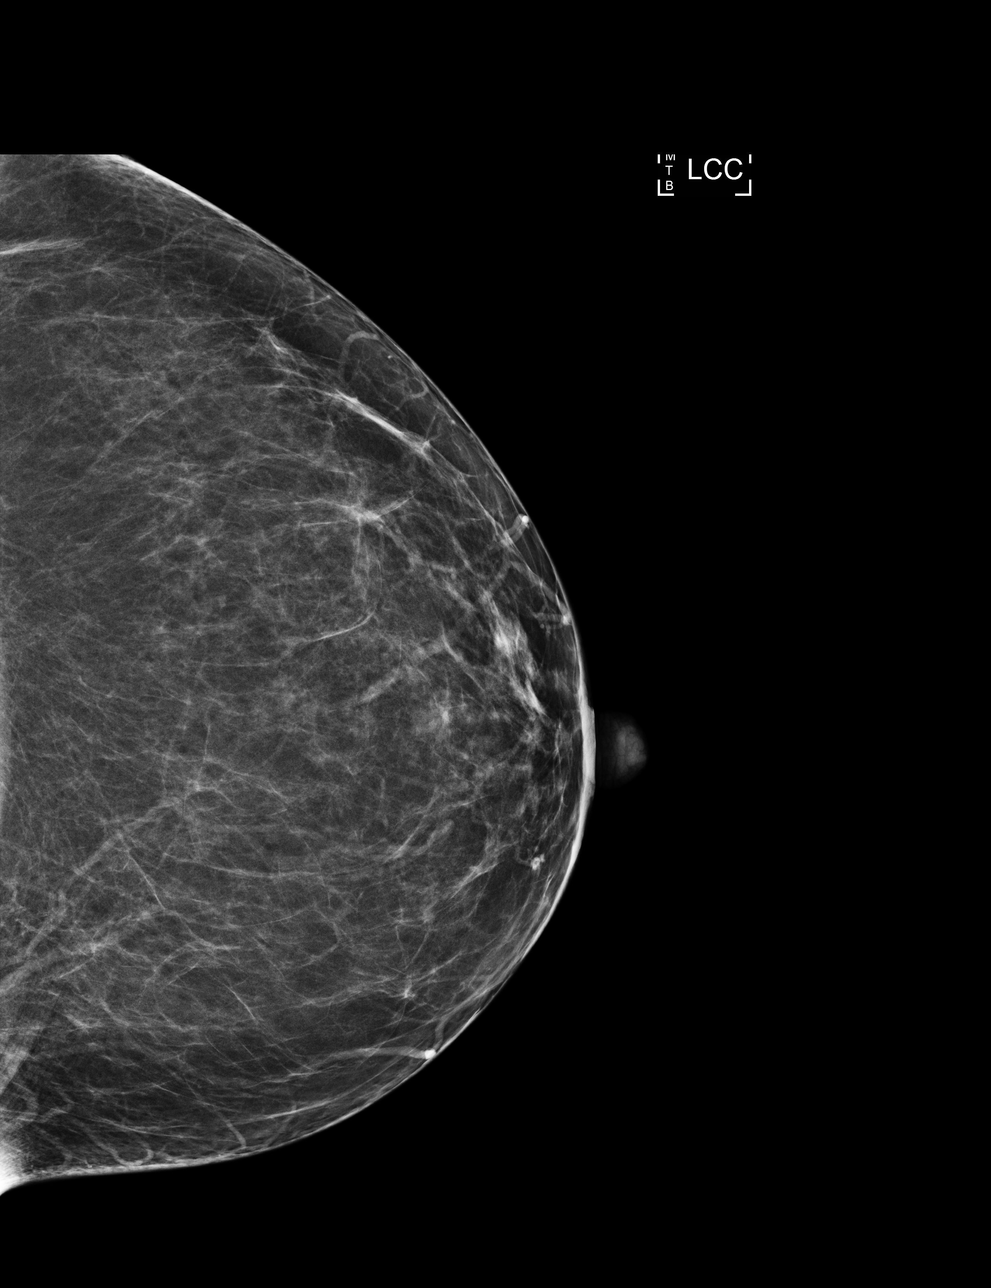

[R CC]
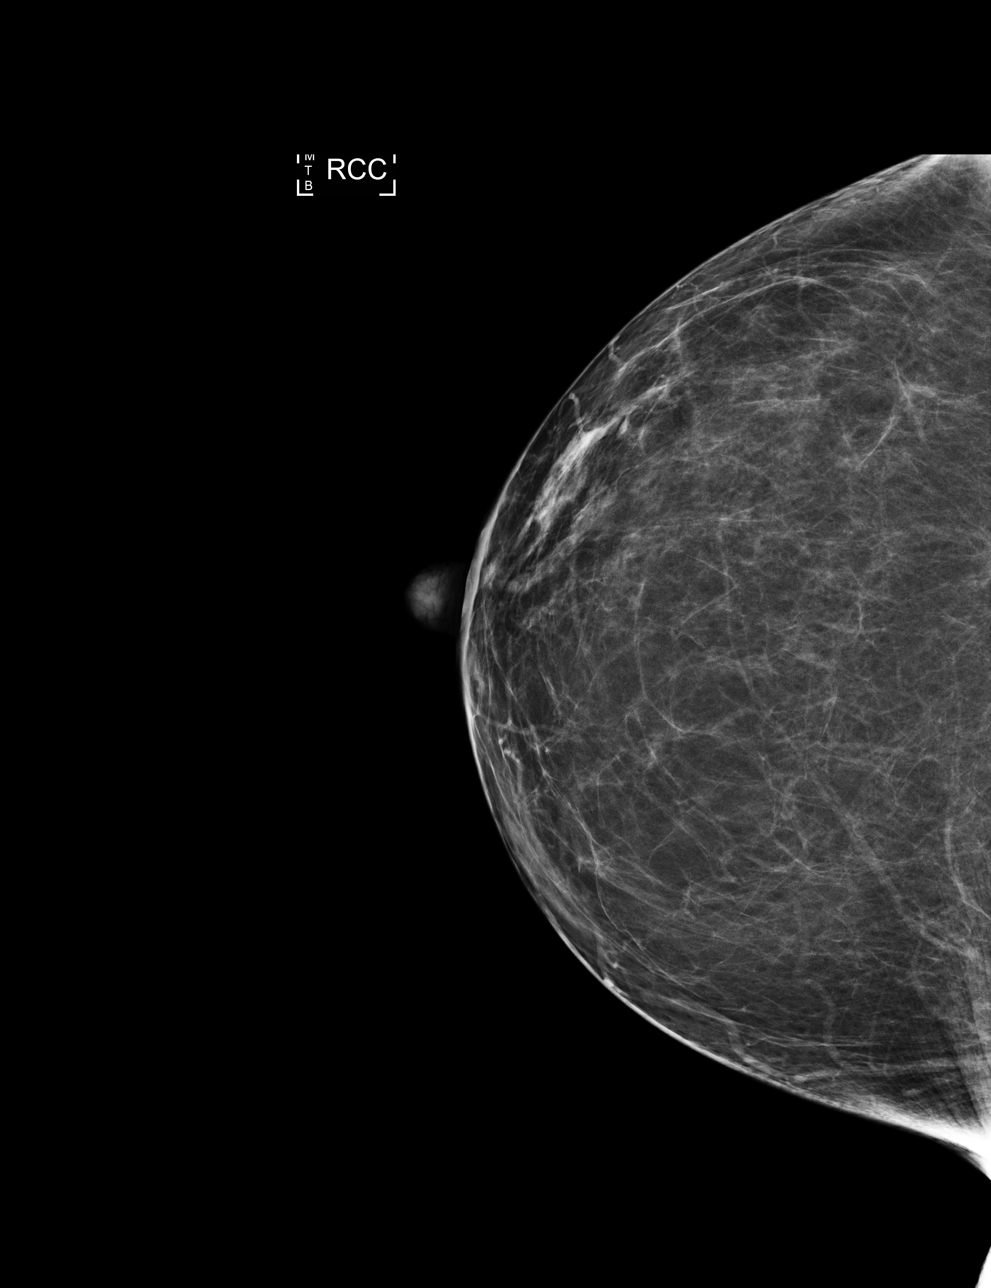

[L MLO]
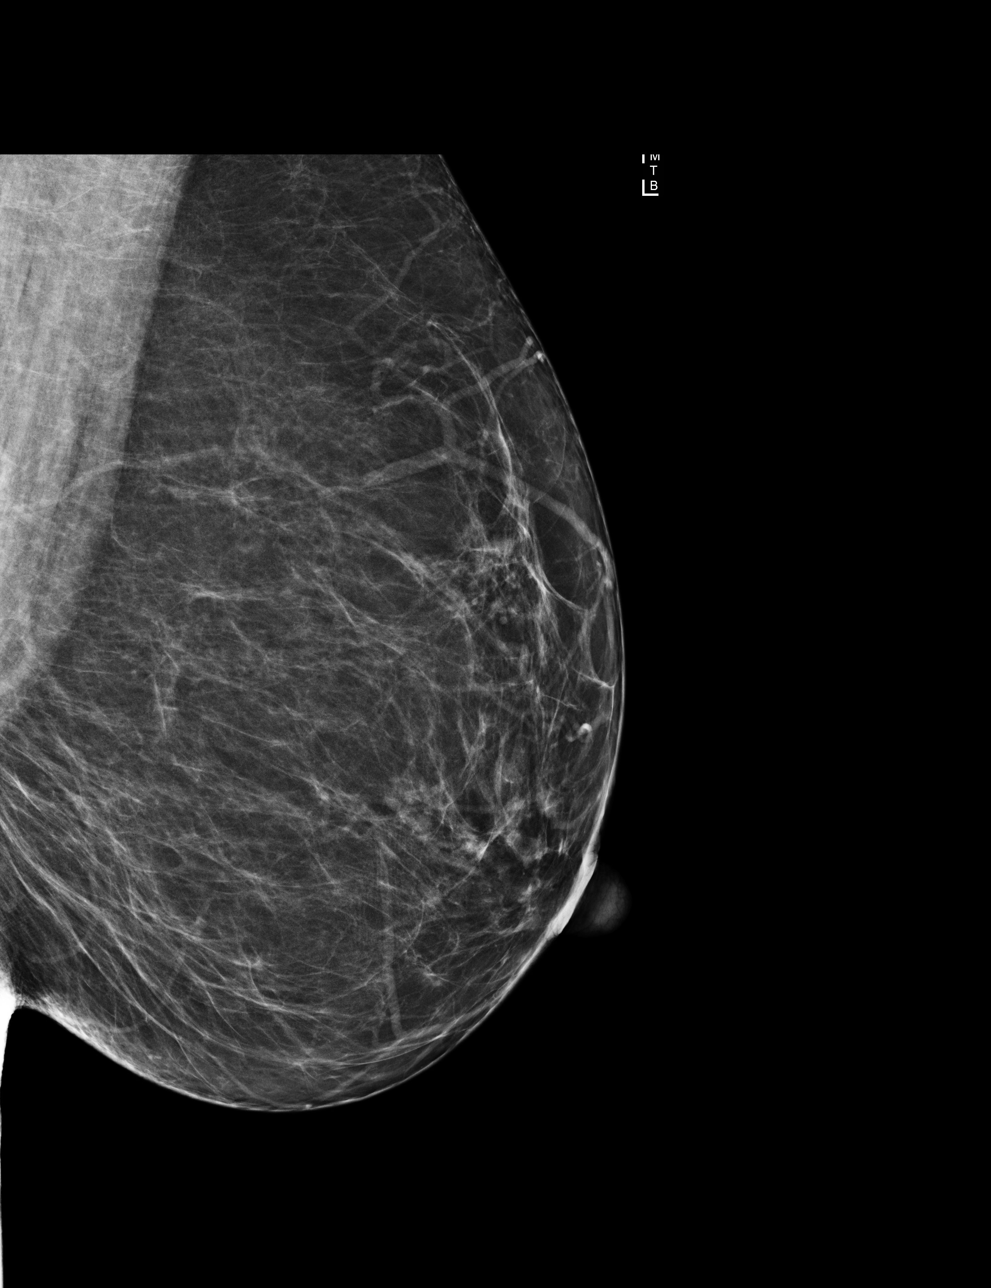

[R MLO]
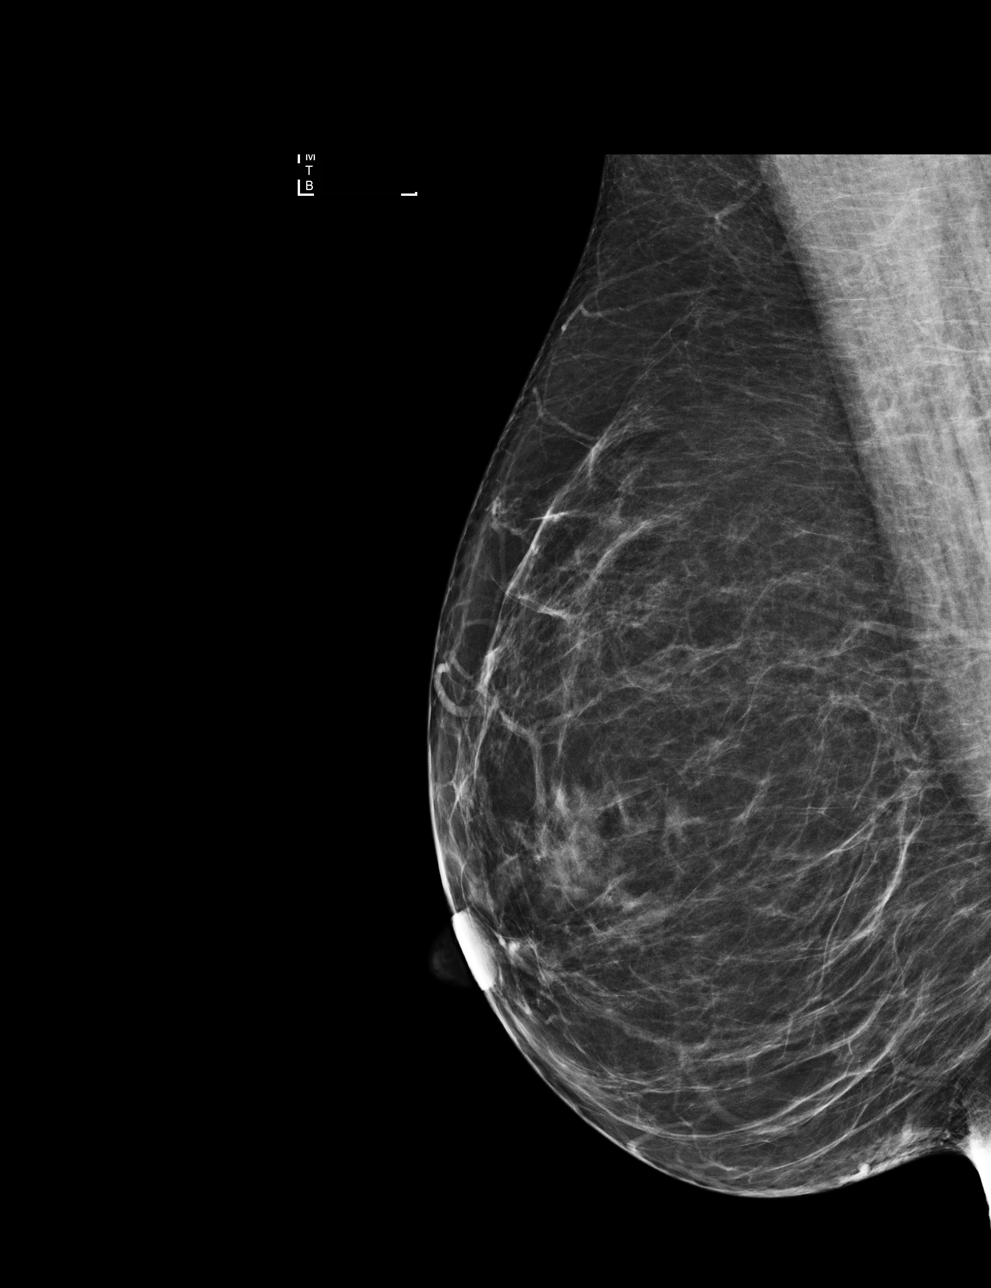

[4 of 4 positions shown; findings below may reference images not displayed]

ACR Breast Density Category b: There are scattered areas of
fibroglandular density.
FINDINGS: There are no findings suspicious for malignancy. Images were
processed with CAD.
IMPRESSION: No mammographic evidence of malignancy. A result letter of this
screening mammogram will be mailed directly to the patient.

RECOMMENDATION:
Screening mammogram in one year. (Code:AS-G-LCT)

BI-RADS CATEGORY  1: Negative.
